# Patient Record
Sex: Female | Born: 1965 | Race: White | Hispanic: No | Smoking: Former smoker
Health system: Southern US, Community
[De-identification: ages and names within clinical notes are randomized; demographics above are authoritative.]

## PROBLEM LIST (undated history)

## (undated) DIAGNOSIS — I10 Essential (primary) hypertension: Secondary | ICD-10-CM

## (undated) HISTORY — PX: KNEE SURGERY: SHX244

---

## 1998-04-24 ENCOUNTER — Other Ambulatory Visit: Admission: RE | Admit: 1998-04-24 | Discharge: 1998-04-24 | Payer: Self-pay | Admitting: Gynecology

## 1998-11-13 ENCOUNTER — Other Ambulatory Visit: Admission: RE | Admit: 1998-11-13 | Discharge: 1998-11-13 | Payer: Self-pay | Admitting: Gynecology

## 1999-02-12 ENCOUNTER — Encounter: Admission: RE | Admit: 1999-02-12 | Discharge: 1999-05-13 | Payer: Self-pay | Admitting: Gynecology

## 1999-06-04 ENCOUNTER — Inpatient Hospital Stay (HOSPITAL_COMMUNITY): Admission: AD | Admit: 1999-06-04 | Discharge: 1999-06-07 | Payer: Self-pay | Admitting: Gynecology

## 1999-07-23 ENCOUNTER — Other Ambulatory Visit: Admission: RE | Admit: 1999-07-23 | Discharge: 1999-07-23 | Payer: Self-pay | Admitting: Gynecology

## 2001-04-13 ENCOUNTER — Other Ambulatory Visit: Admission: RE | Admit: 2001-04-13 | Discharge: 2001-04-13 | Payer: Self-pay | Admitting: Gynecology

## 2003-03-01 ENCOUNTER — Other Ambulatory Visit: Admission: RE | Admit: 2003-03-01 | Discharge: 2003-03-01 | Payer: Self-pay | Admitting: Family Medicine

## 2003-03-09 ENCOUNTER — Encounter: Payer: Self-pay | Admitting: Family Medicine

## 2003-03-09 ENCOUNTER — Ambulatory Visit (HOSPITAL_COMMUNITY): Admission: RE | Admit: 2003-03-09 | Discharge: 2003-03-09 | Payer: Self-pay | Admitting: Family Medicine

## 2003-12-29 ENCOUNTER — Emergency Department (HOSPITAL_COMMUNITY): Admission: EM | Admit: 2003-12-29 | Discharge: 2003-12-30 | Payer: Self-pay | Admitting: Emergency Medicine

## 2006-07-21 ENCOUNTER — Encounter (INDEPENDENT_AMBULATORY_CARE_PROVIDER_SITE_OTHER): Payer: Self-pay | Admitting: *Deleted

## 2006-07-21 ENCOUNTER — Encounter: Admission: RE | Admit: 2006-07-21 | Discharge: 2006-07-21 | Payer: Self-pay | Admitting: Family Medicine

## 2007-09-16 ENCOUNTER — Other Ambulatory Visit: Admission: RE | Admit: 2007-09-16 | Discharge: 2007-09-16 | Payer: Self-pay | Admitting: Obstetrics and Gynecology

## 2008-12-21 ENCOUNTER — Other Ambulatory Visit: Admission: RE | Admit: 2008-12-21 | Discharge: 2008-12-21 | Payer: Self-pay | Admitting: Obstetrics & Gynecology

## 2012-11-27 ENCOUNTER — Encounter (HOSPITAL_COMMUNITY): Payer: Self-pay | Admitting: Emergency Medicine

## 2012-11-27 ENCOUNTER — Emergency Department (HOSPITAL_COMMUNITY)
Admission: EM | Admit: 2012-11-27 | Discharge: 2012-11-28 | Disposition: A | Discharge status: Home / Self Care | Payer: BC Managed Care – PPO | Attending: Emergency Medicine | Admitting: Emergency Medicine

## 2012-11-27 ENCOUNTER — Emergency Department (HOSPITAL_COMMUNITY): Payer: BC Managed Care – PPO

## 2012-11-27 DIAGNOSIS — R5381 Other malaise: Secondary | ICD-10-CM | POA: Insufficient documentation

## 2012-11-27 DIAGNOSIS — Z79899 Other long term (current) drug therapy: Secondary | ICD-10-CM | POA: Insufficient documentation

## 2012-11-27 DIAGNOSIS — Z3202 Encounter for pregnancy test, result negative: Secondary | ICD-10-CM | POA: Insufficient documentation

## 2012-11-27 DIAGNOSIS — J9801 Acute bronchospasm: Secondary | ICD-10-CM

## 2012-11-27 DIAGNOSIS — H9209 Otalgia, unspecified ear: Secondary | ICD-10-CM | POA: Insufficient documentation

## 2012-11-27 DIAGNOSIS — Z87891 Personal history of nicotine dependence: Secondary | ICD-10-CM | POA: Insufficient documentation

## 2012-11-27 DIAGNOSIS — I10 Essential (primary) hypertension: Secondary | ICD-10-CM | POA: Insufficient documentation

## 2012-11-27 DIAGNOSIS — R05 Cough: Secondary | ICD-10-CM

## 2012-11-27 DIAGNOSIS — R059 Cough, unspecified: Secondary | ICD-10-CM | POA: Insufficient documentation

## 2012-11-27 HISTORY — DX: Essential (primary) hypertension: I10

## 2012-11-27 LAB — BLOOD GAS, ARTERIAL
Acid-base deficit: 5.5 mmol/L — ABNORMAL HIGH (ref 0.0–2.0)
Bicarbonate: 18.4 mEq/L — ABNORMAL LOW (ref 20.0–24.0)
TCO2: 16.4 mmol/L (ref 0–100)
pCO2 arterial: 30.3 mmHg — ABNORMAL LOW (ref 35.0–45.0)
pH, Arterial: 7.401 (ref 7.350–7.450)
pO2, Arterial: 93.2 mmHg (ref 80.0–100.0)

## 2012-11-27 LAB — URINALYSIS, ROUTINE W REFLEX MICROSCOPIC
Leukocytes, UA: NEGATIVE
Nitrite: NEGATIVE
Protein, ur: NEGATIVE mg/dL
Specific Gravity, Urine: 1.01 (ref 1.005–1.030)
Urobilinogen, UA: 0.2 mg/dL (ref 0.0–1.0)

## 2012-11-27 LAB — URINE MICROSCOPIC-ADD ON

## 2012-11-27 LAB — BASIC METABOLIC PANEL
CO2: 28 mEq/L (ref 19–32)
Calcium: 9.4 mg/dL (ref 8.4–10.5)
Chloride: 100 mEq/L (ref 96–112)
Creatinine, Ser: 0.65 mg/dL (ref 0.50–1.10)
GFR calc Af Amer: >90 mL/min (ref >90)
Sodium: 136 mEq/L (ref 135–145)

## 2012-11-27 LAB — CBC WITH DIFFERENTIAL/PLATELET
Basophils Absolute: 0 10*3/uL (ref 0.0–0.1)
Eosinophils Relative: 1 % (ref 0–5)
HCT: 40.9 % (ref 36.0–46.0)
Lymphocytes Relative: 9 % — ABNORMAL LOW (ref 12–46)
Lymphs Abs: 0.8 10*3/uL (ref 0.7–4.0)
MCV: 87 fL (ref 78.0–100.0)
Neutro Abs: 8.1 10*3/uL — ABNORMAL HIGH (ref 1.7–7.7)
Platelets: 350 10*3/uL (ref 150–400)
RBC: 4.7 MIL/uL (ref 3.87–5.11)
RDW: 12.6 % (ref 11.5–15.5)
WBC: 9.3 10*3/uL (ref 4.0–10.5)

## 2012-11-27 LAB — PREGNANCY, URINE: Preg Test, Ur: NEGATIVE

## 2012-11-27 LAB — RAPID STREP SCREEN (MED CTR MEBANE ONLY): Streptococcus, Group A Screen (Direct): NEGATIVE

## 2012-11-27 LAB — TROPONIN I: Troponin I: <0.3 ng/mL (ref <0.30)

## 2012-11-27 MED ORDER — ALBUTEROL (5 MG/ML) CONTINUOUS INHALATION SOLN
INHALATION_SOLUTION | RESPIRATORY_TRACT | Status: AC
  Filled 2012-11-27: qty 20

## 2012-11-27 MED ORDER — IPRATROPIUM BROMIDE 0.02 % IN SOLN
1.0000 mg | Freq: Once | RESPIRATORY_TRACT | Status: AC
  Administered 2012-11-27: 1 mg via RESPIRATORY_TRACT
  Filled 2012-11-27: qty 5

## 2012-11-27 MED ORDER — IOHEXOL 350 MG/ML SOLN
100.0000 mL | Freq: Once | INTRAVENOUS | Status: AC | PRN
  Administered 2012-11-27: 100 mL via INTRAVENOUS

## 2012-11-27 MED ORDER — POTASSIUM CHLORIDE CRYS ER 20 MEQ PO TBCR
EXTENDED_RELEASE_TABLET | ORAL | Status: AC
  Filled 2012-11-27: qty 2

## 2012-11-27 MED ORDER — POTASSIUM CHLORIDE 20 MEQ/15ML (10%) PO LIQD
40.0000 meq | Freq: Once | ORAL | Status: DC
  Filled 2012-11-27: qty 30

## 2012-11-27 MED ORDER — ALBUTEROL SULFATE (5 MG/ML) 0.5% IN NEBU
15.0000 mg | INHALATION_SOLUTION | Freq: Once | RESPIRATORY_TRACT | Status: AC
  Administered 2012-11-27: 15 mg via RESPIRATORY_TRACT
  Filled 2012-11-27: qty 3

## 2012-11-27 MED ORDER — PREDNISONE 50 MG PO TABS
60.0000 mg | ORAL_TABLET | Freq: Once | ORAL | Status: AC
  Administered 2012-11-27: 60 mg via ORAL
  Filled 2012-11-27: qty 1

## 2012-11-27 MED ORDER — POTASSIUM CHLORIDE 20 MEQ PO PACK: 40.0000 meq | PACK | Freq: Once | ORAL | Status: DC

## 2012-11-27 MED ORDER — POTASSIUM CHLORIDE CRYS ER 20 MEQ PO TBCR
40.0000 meq | EXTENDED_RELEASE_TABLET | Freq: Two times a day (BID) | ORAL | Status: DC
  Administered 2012-11-27: 40 meq via ORAL

## 2012-11-27 NOTE — ED Provider Notes (Signed)
History     CSN: [id number]  Arrival date & time 11/27/12  1940   First MD Initiated Contact with Patient 11/27/12 1952      Chief Complaint  Patient presents with  . Shortness of Breath     HPI Pt was seen at 2005.   Per pt, c/o gradual onset and worsening of persistent SOB, cough, and "wheezing" for the past 3 to 4 weeks.  States she has been eval by her PMD x2 for same, dx bronchitis, tx steroids, MDI, and 2 different abx (levaquin and omnicef) without relief.  Pt states her symptoms continue to worsen and have been associated with generalized weakness/fatigue, sore throat and ears congestion.  Denies CP/palpitations, no back pain, no abd pain, no N/V/D, no fevers, no rash.     PMD: [place] Past Medical History  Diagnosis Date  . Hypertension     History reviewed. No pertinent past surgical history.  Family History  Problem Relation Age of Onset  . Diabetes Mother   . Diabetes Brother     History  Substance Use Topics  . Smoking status: Former Smoker    Types: Cigarettes    Quit date: 11/27/2002  . Smokeless tobacco: Not on file  . Alcohol Use: Yes     Comment: ocass    OB History    Grav Para Term Preterm Abortions TAB SAB Ect Mult Living   2 2 2              Review of Systems ROS: Statement: All systems negative except as marked or noted in the HPI; Constitutional: Negative for fever and chills. +generalized weakness/fatigue ; ; Eyes: Negative for eye pain, redness and discharge. ; ; ENMT: Negative for ear pain, hoarseness, nasal congestion, sinus pressure and +sore throat, ears congestion. ; ; Cardiovascular: Negative for chest pain, palpitations, diaphoresis, and peripheral edema. ; ; Respiratory: +cough, wheezing, SOB. Negative for stridor. ; ; Gastrointestinal: Negative for nausea, vomiting, diarrhea, abdominal pain, blood in stool, hematemesis, jaundice and rectal bleeding. . ; ; Genitourinary: Negative for dysuria, flank pain and hematuria. ; ;  Musculoskeletal: Negative for back pain and neck pain. Negative for swelling and trauma.; ; Skin: Negative for pruritus, rash, abrasions, blisters, bruising and skin lesion.; ; Neuro: Negative for headache, lightheadedness and neck stiffness. Negative for altered level of consciousness , altered mental status, extremity weakness, paresthesias, involuntary movement, seizure and syncope.     Allergies  Penicillins  Home Medications   Current Outpatient Rx  Name  Route  Sig  Dispense  Refill  . CEFDINIR 300 MG PO CAPS   Oral   Take 300 mg by mouth 2 (two) times daily. 11/20/2012         . CLONAZEPAM 1 MG PO TABS   Oral   Take 1 mg by mouth at bedtime as needed. sleep         . HYDROCODONE-HOMATROPINE 5-1.5 MG/5ML PO SYRP   Oral   Take 5 mLs by mouth every 6 (six) hours as needed. cough         . LEVONORGESTREL 20 MCG/24HR IU IUD   Intrauterine   1 each by Intrauterine route once.         Marland Kitchen LISINOPRIL 5 MG PO TABS   Oral   Take 5 mg by mouth daily.         Marland Kitchen RANITIDINE HCL 150 MG PO TABS   Oral   Take 150 mg by mouth 2 (two) times daily.         Marland Kitchen  VENLAFAXINE HCL ER 150 MG PO CP24   Oral   Take 150 mg by mouth daily.           BP 152/77  Pulse 95  Temp 98.6 F (37 C) (Oral)  Resp 18  SpO2 96%  Physical Exam 2010: Physical examination:  Nursing notes reviewed; Vital signs and O2 SAT reviewed;  Constitutional: Well developed, Well nourished, Well hydrated, In no acute distress; Head:  Normocephalic, atraumatic; Eyes: EOMI, PERRL, No scleral icterus; ENMT: TM's clear bilat. +edemetous nasal turbinates bilat with clear rhinorrhea. Mouth and pharynx normal, Mucous membranes moist; Neck: Supple, Full range of motion, No lymphadenopathy; Cardiovascular: Regular rate and rhythm, No gallop; Respiratory: Breath sounds coarse & equal bilaterally, scattered faint wheezes. No audible wheezing. Speaking in phrases, sitting upright. Tachypneic.; Chest: Nontender, Movement  normal; Abdomen: Soft, Nontender, Nondistended, Normal bowel sounds; Genitourinary: No CVA tenderness; Extremities: Pulses normal, No tenderness, No edema, No calf edema or asymmetry.; Neuro: AA&Ox3, Major CN grossly intact.  Speech clear. No gross focal motor or sensory deficits in extremities.; Skin: Color normal, Warm, Dry.   ED Course  Procedures    MDM  MDM Reviewed: previous chart, nursing note and vitals Interpretation: labs and x-ray   Results for orders placed during the hospital encounter of 11/27/12  RAPID STREP SCREEN      Component Value Range   Streptococcus, Group A Screen (Direct) NEGATIVE  NEGATIVE  BASIC METABOLIC PANEL      Component Value Range   Sodium 136  135 - 145 mEq/L   Potassium 3.2 (*) 3.5 - 5.1 mEq/L   Chloride 100  96 - 112 mEq/L   CO2 28  19 - 32 mEq/L   Glucose, Bld 154 (*) 70 - 99 mg/dL   BUN 7  6 - 23 mg/dL   Creatinine, Ser 1.61  0.50 - 1.10 mg/dL   Calcium 9.4  8.4 - 09.6 mg/dL   GFR calc non Af Amer >90  >90 mL/min   GFR calc Af Amer >90  >90 mL/min  CBC WITH DIFFERENTIAL      Component Value Range   WBC 9.3  4.0 - 10.5 K/uL   RBC 4.70  3.87 - 5.11 MIL/uL   Hemoglobin 13.8  12.0 - 15.0 g/dL   HCT 04.5  40.9 - 81.1 %   MCV 87.0  78.0 - 100.0 fL   MCH 29.4  26.0 - 34.0 pg   MCHC 33.7  30.0 - 36.0 g/dL   RDW 91.4  78.2 - 95.6 %   Platelets 350  150 - 400 K/uL   Neutrophils Relative 87 (*) 43 - 77 %   Neutro Abs 8.1 (*) 1.7 - 7.7 K/uL   Lymphocytes Relative 9 (*) 12 - 46 %   Lymphs Abs 0.8  0.7 - 4.0 K/uL   Monocytes Relative 3  3 - 12 %   Monocytes Absolute 0.3  0.1 - 1.0 K/uL   Eosinophils Relative 1  0 - 5 %   Eosinophils Absolute 0.1  0.0 - 0.7 K/uL   Basophils Relative 0  0 - 1 %   Basophils Absolute 0.0  0.0 - 0.1 K/uL  TROPONIN I      Component Value Range   Troponin I <0.30  <0.30 ng/mL  D-DIMER, QUANTITATIVE      Component Value Range   D-Dimer, Quant 0.29  0.00 - 0.48 ug/mL-FEU  URINALYSIS, ROUTINE W REFLEX MICROSCOPIC       Component Value Range  Color, Urine YELLOW  YELLOW   APPearance CLEAR  CLEAR   Specific Gravity, Urine 1.010  1.005 - 1.030   pH 6.5  5.0 - 8.0   Glucose, UA NEGATIVE  NEGATIVE mg/dL   Hgb urine dipstick TRACE (*) NEGATIVE   Bilirubin Urine NEGATIVE  NEGATIVE   Ketones, ur NEGATIVE  NEGATIVE mg/dL   Protein, ur NEGATIVE  NEGATIVE mg/dL   Urobilinogen, UA 0.2  0.0 - 1.0 mg/dL   Nitrite NEGATIVE  NEGATIVE   Leukocytes, UA NEGATIVE  NEGATIVE  PREGNANCY, URINE      Component Value Range   Preg Test, Ur NEGATIVE  NEGATIVE  URINE MICROSCOPIC-ADD ON      Component Value Range   Squamous Epithelial / LPF MANY (*) RARE   WBC, UA 3-6  <3 WBC/hpf   RBC / HPF 3-6  <3 RBC/hpf   Bacteria, UA RARE  RARE   Dg Chest 2 View 11/27/2012  *RADIOLOGY REPORT*  Clinical Data: Cough and rule out infiltrate.  CHEST - 2 VIEW  Comparison: 11/18/2012  Findings: Two views of the chest were obtained.  There is a questionable lung nodule in the posterior lung base that roughly measures 1 cm.  Slightly increased interstitial densities in the lower chest, particularly in the left lower lung.  No evidence for consolidation or airspace disease.  Heart size is normal.  IMPRESSION: Slightly prominent interstitial markings.  Findings could represent atelectasis.  Cannot exclude subtle disease in the left mid and lower lung region.  Concern for a 1 cm nodular density in the posterior lower chest. Recommend further characterization with a chest CT to rule out a pulmonary nodule.   Original Report Authenticated By: Richarda Overlie, M.D.     2225:  Pt states she feels "no better" after hour long neb and prednisone.  Continues tachypneic, speaking in phrases, lungs coarse, Sats 96% R/A.  Will obtain CT-A chest to r/o PE, pneumonia and further delineate nodular density on CXR above.  Will also obtain ABG.     2315:  CT-A chest and ABG pending.  Sign out to Dr. Colon Branch.        Laray Anger, DO 11/28/12 1514

## 2012-11-27 NOTE — ED Notes (Signed)
Pt in ct 

## 2012-11-27 NOTE — ED Notes (Signed)
Pt here for SOB. Has been seen and treated with mult abx. Worsening cough, work of breathing and weakness.

## 2012-11-28 MED ORDER — ALBUTEROL SULFATE (5 MG/ML) 0.5% IN NEBU
5.0000 mg | INHALATION_SOLUTION | Freq: Once | RESPIRATORY_TRACT | Status: AC
  Administered 2012-11-28: 5 mg via RESPIRATORY_TRACT
  Filled 2012-11-28: qty 1

## 2012-11-28 MED ORDER — PREDNISONE 10 MG PO TABS: 20.0000 mg | ORAL_TABLET | Freq: Every day | ORAL | Status: DC

## 2012-11-28 MED ORDER — ALBUTEROL SULFATE HFA 108 (90 BASE) MCG/ACT IN AERS: INHALATION_SPRAY | RESPIRATORY_TRACT | Status: DC

## 2012-11-28 NOTE — ED Notes (Signed)
In to speak with pt. Pt made aware that all results are back. Pt still questioning why she feels so bad and is short of breath. Told pt I would have EDP to come speak with her. EDP aware and will see pt.

## 2012-11-28 NOTE — ED Notes (Signed)
Pt has been discharged but her ride will not be here for 45 mins. Pt given ginger ale and allowed to stay in room until ride gets here.

## 2012-11-28 NOTE — ED Provider Notes (Signed)
2315 Assumed care/disposition of patient who presents with persistent SOB associated with generalized weakness, cough and increased respiratory effort despite having been treated with several courses of antibiotics.She has been given nebulizer treatment and steroids.Potassium was slightly low and she has been given potassium.  Awaiting CT to r/o PE and ABG. 0038 ABG with respiratory alkalosis. CT negative for PE. Shows patchy atelectasis. Chest: Occasional end expiratory wheeze, crackles at both bases. Able to breath with mouth closed. Talks is short phrases. Respiratory rate 18. Ordered albuterol treatment. Reviewed results with the patient. Will discharge on prednisone and albuterol. Referral to pulmonologist. Follow up with PCP.  Pt stable in ED with no significant deterioration in condition.The patient appears reasonably screened and/or stabilized for discharge and I doubt any other medical condition or other [facility] requiring further screening, evaluation, or treatment in the ED at this time prior to discharge.  Results for orders placed during the hospital encounter of 11/27/12  RAPID STREP SCREEN      Component Value Range   Streptococcus, Group A Screen (Direct) NEGATIVE  NEGATIVE  BASIC METABOLIC PANEL      Component Value Range   Sodium 136  135 - 145 mEq/L   Potassium 3.2 (*) 3.5 - 5.1 mEq/L   Chloride 100  96 - 112 mEq/L   CO2 28  19 - 32 mEq/L   Glucose, Bld 154 (*) 70 - 99 mg/dL   BUN 7  6 - 23 mg/dL   Creatinine, Ser 2.95  0.50 - 1.10 mg/dL   Calcium 9.4  8.4 - 62.1 mg/dL   GFR calc non Af Amer >90  >90 mL/min   GFR calc Af Amer >90  >90 mL/min  CBC WITH DIFFERENTIAL      Component Value Range   WBC 9.3  4.0 - 10.5 K/uL   RBC 4.70  3.87 - 5.11 MIL/uL   Hemoglobin 13.8  12.0 - 15.0 g/dL   HCT 30.8  65.7 - 84.6 %   MCV 87.0  78.0 - 100.0 fL   MCH 29.4  26.0 - 34.0 pg   MCHC 33.7  30.0 - 36.0 g/dL   RDW 96.2  95.2 - 84.1 %   Platelets 350  150 - 400 K/uL   Neutrophils Relative  87 (*) 43 - 77 %   Neutro Abs 8.1 (*) 1.7 - 7.7 K/uL   Lymphocytes Relative 9 (*) 12 - 46 %   Lymphs Abs 0.8  0.7 - 4.0 K/uL   Monocytes Relative 3  3 - 12 %   Monocytes Absolute 0.3  0.1 - 1.0 K/uL   Eosinophils Relative 1  0 - 5 %   Eosinophils Absolute 0.1  0.0 - 0.7 K/uL   Basophils Relative 0  0 - 1 %   Basophils Absolute 0.0  0.0 - 0.1 K/uL  TROPONIN I      Component Value Range   Troponin I <0.30  <0.30 ng/mL  D-DIMER, QUANTITATIVE      Component Value Range   D-Dimer, Quant 0.29  0.00 - 0.48 ug/mL-FEU  URINALYSIS, ROUTINE W REFLEX MICROSCOPIC      Component Value Range   Color, Urine YELLOW  YELLOW   APPearance CLEAR  CLEAR   Specific Gravity, Urine 1.010  1.005 - 1.030   pH 6.5  5.0 - 8.0   Glucose, UA NEGATIVE  NEGATIVE mg/dL   Hgb urine dipstick TRACE (*) NEGATIVE   Bilirubin Urine NEGATIVE  NEGATIVE   Ketones, ur NEGATIVE  NEGATIVE  mg/dL   Protein, ur NEGATIVE  NEGATIVE mg/dL   Urobilinogen, UA 0.2  0.0 - 1.0 mg/dL   Nitrite NEGATIVE  NEGATIVE   Leukocytes, UA NEGATIVE  NEGATIVE  PREGNANCY, URINE      Component Value Range   Preg Test, Ur NEGATIVE  NEGATIVE  URINE MICROSCOPIC-ADD ON      Component Value Range   Squamous Epithelial / LPF MANY (*) RARE   WBC, UA 3-6  <3 WBC/hpf   RBC / HPF 3-6  <3 RBC/hpf   Bacteria, UA RARE  RARE  BLOOD GAS, ARTERIAL      Component Value Range   FIO2 0.21     Delivery systems ROOM AIR     pH, Arterial 7.401  7.350 - 7.450   pCO2 arterial 30.3 (*) 35.0 - 45.0 mmHg   pO2, Arterial 93.2  80.0 - 100.0 mmHg   Bicarbonate 18.4 (*) 20.0 - 24.0 mEq/L   TCO2 16.4  0 - 100 mmol/L   Acid-base deficit 5.5 (*) 0.0 - 2.0 mmol/L   O2 Saturation 97.6     Patient temperature 37.0     Collection site LEFT RADIAL     Drawn by COLLECTED BY RT     Sample type ARTERIAL     Allens test (pass/fail) PASS  PASS  Dg Chest 2 View  11/27/2012  *RADIOLOGY REPORT*  Clinical Data: Cough and rule out infiltrate.  CHEST - 2 VIEW  Comparison:  11/18/2012  Findings: Two views of the chest were obtained.  There is a questionable lung nodule in the posterior lung base that roughly measures 1 cm.  Slightly increased interstitial densities in the lower chest, particularly in the left lower lung.  No evidence for consolidation or airspace disease.  Heart size is normal.  IMPRESSION: Slightly prominent interstitial markings.  Findings could represent atelectasis.  Cannot exclude subtle disease in the left mid and lower lung region.  Concern for a 1 cm nodular density in the posterior lower chest. Recommend further characterization with a chest CT to rule out a pulmonary nodule.   Original Report Authenticated By: Richarda Overlie, M.D.    Ct Angio Chest Pe W/cm &/or Wo Cm  11/27/2012  *RADIOLOGY REPORT*  Clinical Data: Shortness of breath and worsening cough.  Weakness.  CT ANGIOGRAPHY CHEST  Technique:  Multidetector CT imaging of the chest using the standard protocol during bolus administration of intravenous contrast. Multiplanar reconstructed images including MIPs were obtained and reviewed to evaluate the vascular anatomy.  Contrast: OMNIPAQUE IOHEXOL 350 MG/ML SOLN  Comparison: Chest radiograph performed earlier today at 08:38 p.m.  Findings: There is no evidence of significant pulmonary embolus.  Mild patchy bilateral atelectasis is noted.  The lungs are otherwise clear.  There is no evidence of pleural effusion or pneumothorax.  No masses are identified; no abnormal focal contrast enhancement is seen.  Scattered bilateral hilar and peribronchial nodes remain borderline normal in size.  The mediastinum is unremarkable in appearance.  No mediastinal lymphadenopathy is seen.  No pericardial effusion is identified.  The great vessels are grossly unremarkable in appearance.  No axillary lymphadenopathy is seen.  The visualized portions of the thyroid gland are unremarkable in appearance.  The visualized portions of the liver and spleen are unremarkable. The  visualized portions of the pancreas, gallbladder, stomach, adrenal glands and kidneys are within normal limits.  No acute osseous abnormalities are seen.  IMPRESSION:  1.  No evidence of significant pulmonary embolus. 2.  Mild patchy  bilateral atelectasis noted; lungs otherwise clear.   Original Report Authenticated By: Tonia Ghent, M.D.     Nicoletta Dress. Colon Branch, MD 11/28/12 1610

## 2012-11-28 NOTE — ED Notes (Signed)
edp in with pt, orders received

## 2014-09-10 ENCOUNTER — Encounter (HOSPITAL_COMMUNITY): Payer: Self-pay | Admitting: Emergency Medicine

## 2015-03-22 ENCOUNTER — Other Ambulatory Visit: Payer: Self-pay | Admitting: Physician Assistant

## 2016-07-18 DIAGNOSIS — R7303 Prediabetes: Secondary | ICD-10-CM | POA: Insufficient documentation

## 2016-07-18 DIAGNOSIS — L309 Dermatitis, unspecified: Secondary | ICD-10-CM | POA: Insufficient documentation

## 2016-07-18 DIAGNOSIS — E669 Obesity, unspecified: Secondary | ICD-10-CM | POA: Insufficient documentation

## 2016-07-18 DIAGNOSIS — N951 Menopausal and female climacteric states: Secondary | ICD-10-CM | POA: Insufficient documentation

## 2016-07-18 DIAGNOSIS — F411 Generalized anxiety disorder: Secondary | ICD-10-CM | POA: Insufficient documentation

## 2016-07-18 DIAGNOSIS — F339 Major depressive disorder, recurrent, unspecified: Secondary | ICD-10-CM | POA: Insufficient documentation

## 2016-07-18 DIAGNOSIS — I10 Essential (primary) hypertension: Secondary | ICD-10-CM | POA: Insufficient documentation

## 2016-07-18 DIAGNOSIS — J45909 Unspecified asthma, uncomplicated: Secondary | ICD-10-CM | POA: Insufficient documentation

## 2016-07-21 DIAGNOSIS — E7800 Pure hypercholesterolemia, unspecified: Secondary | ICD-10-CM | POA: Insufficient documentation

## 2017-06-02 ENCOUNTER — Ambulatory Visit: Payer: Self-pay | Admitting: Podiatry

## 2019-10-19 ENCOUNTER — Emergency Department (HOSPITAL_COMMUNITY)
Admission: EM | Admit: 2019-10-19 | Discharge: 2019-10-19 | Disposition: A | Discharge status: Home / Self Care | Payer: Commercial Managed Care - PPO | Attending: Emergency Medicine | Admitting: Emergency Medicine

## 2019-10-19 ENCOUNTER — Other Ambulatory Visit: Payer: Self-pay

## 2019-10-19 ENCOUNTER — Emergency Department (HOSPITAL_COMMUNITY): Payer: Commercial Managed Care - PPO

## 2019-10-19 ENCOUNTER — Encounter (HOSPITAL_COMMUNITY): Payer: Self-pay

## 2019-10-19 DIAGNOSIS — S0990XA Unspecified injury of head, initial encounter: Secondary | ICD-10-CM

## 2019-10-19 DIAGNOSIS — Y939 Activity, unspecified: Secondary | ICD-10-CM | POA: Insufficient documentation

## 2019-10-19 DIAGNOSIS — I10 Essential (primary) hypertension: Secondary | ICD-10-CM | POA: Insufficient documentation

## 2019-10-19 DIAGNOSIS — S0181XA Laceration without foreign body of other part of head, initial encounter: Secondary | ICD-10-CM

## 2019-10-19 DIAGNOSIS — W010XXA Fall on same level from slipping, tripping and stumbling without subsequent striking against object, initial encounter: Secondary | ICD-10-CM | POA: Diagnosis not present

## 2019-10-19 DIAGNOSIS — S0083XA Contusion of other part of head, initial encounter: Secondary | ICD-10-CM

## 2019-10-19 DIAGNOSIS — S060X0A Concussion without loss of consciousness, initial encounter: Secondary | ICD-10-CM

## 2019-10-19 DIAGNOSIS — Z79899 Other long term (current) drug therapy: Secondary | ICD-10-CM | POA: Diagnosis not present

## 2019-10-19 DIAGNOSIS — Z87891 Personal history of nicotine dependence: Secondary | ICD-10-CM | POA: Insufficient documentation

## 2019-10-19 DIAGNOSIS — Y929 Unspecified place or not applicable: Secondary | ICD-10-CM | POA: Insufficient documentation

## 2019-10-19 DIAGNOSIS — Y999 Unspecified external cause status: Secondary | ICD-10-CM | POA: Diagnosis not present

## 2019-10-19 MED ORDER — HYDROCODONE-ACETAMINOPHEN 5-325 MG PO TABS
2.0000 | ORAL_TABLET | Freq: Once | ORAL | Status: AC
  Administered 2019-10-19: 2 via ORAL
  Filled 2019-10-19: qty 2

## 2019-10-19 MED ORDER — LIDOCAINE-EPINEPHRINE (PF) 2 %-1:200000 IJ SOLN
INTRAMUSCULAR | Status: AC
  Filled 2019-10-19: qty 20

## 2019-10-19 MED ORDER — LIDOCAINE-EPINEPHRINE 2 %-1:100000 IJ SOLN
20.0000 mL | Freq: Once | INTRAMUSCULAR | Status: DC
  Filled 2019-10-19: qty 20

## 2019-10-19 MED ORDER — KETOROLAC TROMETHAMINE 15 MG/ML IJ SOLN
15.0000 mg | Freq: Once | INTRAMUSCULAR | Status: AC
  Administered 2019-10-19: 15 mg via INTRAVENOUS
  Filled 2019-10-19: qty 1

## 2019-10-19 MED ORDER — SODIUM CHLORIDE 0.9 % IV BOLUS
500.0000 mL | Freq: Once | INTRAVENOUS | Status: AC
  Administered 2019-10-19: 1000 mL via INTRAVENOUS

## 2019-10-19 MED ORDER — CYCLOBENZAPRINE HCL 10 MG PO TABS: 10.0000 mg | ORAL_TABLET | Freq: Two times a day (BID) | ORAL | 0 refills | Status: AC | PRN

## 2019-10-19 MED ORDER — TETANUS-DIPHTH-ACELL PERTUSSIS 5-2.5-18.5 LF-MCG/0.5 IM SUSP
0.5000 mL | Freq: Once | INTRAMUSCULAR | Status: AC
  Administered 2019-10-19: 0.5 mL via INTRAMUSCULAR
  Filled 2019-10-19: qty 0.5

## 2019-10-19 MED ORDER — DIAZEPAM 5 MG PO TABS
5.0000 mg | ORAL_TABLET | Freq: Once | ORAL | Status: AC
  Administered 2019-10-19: 5 mg via ORAL
  Filled 2019-10-19: qty 1

## 2019-10-19 MED ORDER — MORPHINE SULFATE (PF) 4 MG/ML IV SOLN
4.0000 mg | Freq: Once | INTRAVENOUS | Status: AC
  Administered 2019-10-19: 11:00:00 4 mg via INTRAVENOUS
  Filled 2019-10-19: qty 1

## 2019-10-19 MED ORDER — HYDROCODONE-ACETAMINOPHEN 5-325 MG PO TABS: 1.0000 | ORAL_TABLET | Freq: Four times a day (QID) | ORAL | 0 refills | Status: AC | PRN

## 2019-10-19 NOTE — Discharge Instructions (Signed)
Use ice, Tylenol and ibuprofen as needed for pain.  Watch for signs of infection.  You can shower as usual just be careful with the sutures.  The stitches will absorb in approximately 1 week. Return for recurrent vomiting, neurologic concerns or new findings.

## 2019-10-19 NOTE — ED Triage Notes (Signed)
Pt reports she tripped on the sidewalk and fell.  Pt struck head on steps.  EMS says pt has a large laceration to forehead.  Bleeding controlled.  EMS gave 19mcg fentanyl and 4mg  zofran pta.  No loss of consciousness

## 2019-10-19 NOTE — ED Notes (Signed)
ED Provider at bedside. 

## 2019-10-19 NOTE — ED Provider Notes (Addendum)
[facility] EMERGENCY DEPARTMENT Provider Note   CSN: [id number] Arrival date & time: 10/19/19  0957     History Chief Complaint  Patient presents with  . Fall    Karen Mccormick is a 53 y.o. female.  Patient presents for assessment after head injury.  Patient was walking had mechanical fall and tripped and hit her head on the edge of a step with laceration to left forehead.  No syncope, no seizures, no blood thinners.  No vomiting.  Pain significant fentanyl given on route.        Past Medical History:  Diagnosis Date  . Hypertension     There are no problems to display for this patient.   Past Surgical History:  Procedure Laterality Date  . KNEE SURGERY       OB History    Gravida  2   Para  2   Term  2   Preterm      AB      Living        SAB      TAB      Ectopic      Multiple      Live Births              Family History  Problem Relation Age of Onset  . Diabetes Mother   . Diabetes Brother     Social History   Tobacco Use  . Smoking status: Former Smoker    Types: Cigarettes    Quit date: 11/27/2002    Years since quitting: 16.9  . Smokeless tobacco: Never Used  Substance Use Topics  . Alcohol use: Yes    Comment: ocass  . Drug use: No    Home Medications Prior to Admission medications   Medication Sig Start Date End Date Taking? Authorizing Provider  albuterol (PROVENTIL HFA;VENTOLIN HFA) 108 (90 BASE) MCG/ACT inhaler 2 puffs 4 times a day for the next 4 days then as needed for wheezing. 11/28/12   Prentiss Bells, MD  cefdinir (OMNICEF) 300 MG capsule Take 300 mg by mouth 2 (two) times daily. 11/20/2012    [provider]  clonazePAM (KLONOPIN) 1 MG tablet Take 1 mg by mouth at bedtime as needed. sleep    [provider]  cyclobenzaprine (FLEXERIL) 10 MG tablet Take 1 tablet (10 mg total) by mouth 2 (two) times daily as needed for muscle spasms. 10/19/19   Elnora Morrison, MD  HYDROcodone-acetaminophen  (NORCO) 5-325 MG tablet Take 1-2 tablets by mouth every 6 (six) hours as needed for up to 3 days. 10/19/19 10/22/19  Elnora Morrison, MD  HYDROcodone-homatropine [facility]) 5-1.5 MG/5ML syrup Take 5 mLs by mouth every 6 (six) hours as needed. cough    [provider]  levonorgestrel (MIRENA) 20 MCG/24HR IUD 1 each by Intrauterine route once.    [provider]  lisinopril (PRINIVIL,ZESTRIL) 5 MG tablet Take 5 mg by mouth daily.    [provider]  predniSONE (DELTASONE) 10 MG tablet Take 2 tablets (20 mg total) by mouth daily. 11/28/12   Prentiss Bells, MD  ranitidine (ZANTAC) 150 MG tablet Take 150 mg by mouth 2 (two) times daily.    [provider]  venlafaxine XR (EFFEXOR-XR) 150 MG 24 hr capsule Take 150 mg by mouth daily.    [provider]    Allergies    Penicillins  Review of Systems   Review of Systems  Constitutional: Negative for chills and fever.  HENT:  Negative for congestion.   Eyes: Negative for visual disturbance.  Respiratory: Negative for shortness of breath.   Cardiovascular: Negative for chest pain.  Gastrointestinal: Negative for abdominal pain and vomiting.  Genitourinary: Negative for dysuria and flank pain.  Musculoskeletal: Negative for back pain, neck pain and neck stiffness.  Skin: Positive for wound. Negative for rash.  Neurological: Positive for headaches. Negative for light-headedness.    Physical Exam Updated Vital Signs BP (!) 148/84 (BP Location: Right Arm)   Pulse 74   Temp 97.8 F (36.6 C) (Oral)   Resp 16   Ht 5\' 6"  (1.676 m)   Wt 102.1 kg   SpO2 99%   BMI 36.32 kg/m   Physical Exam Vitals and nursing note reviewed.  Constitutional:      Appearance: She is well-developed.  HENT:     Head: Normocephalic.     Comments: Patient has 6 cm gaping laceration linear left upper forehead with persistent bleeding that is controlled with pressure.  No step-off. Eyes:     General:        Right eye: No  discharge.        Left eye: No discharge.     Conjunctiva/sclera: Conjunctivae normal.  Neck:     Trachea: No tracheal deviation.  Cardiovascular:     Rate and Rhythm: Normal rate and regular rhythm.  Pulmonary:     Effort: Pulmonary effort is normal.     Breath sounds: Normal breath sounds.  Abdominal:     General: There is no distension.     Palpations: Abdomen is soft.     Tenderness: There is no abdominal tenderness. There is no guarding.  Musculoskeletal:        General: Swelling and tenderness present.     Cervical back: Normal range of motion and neck supple.     Comments: Patient has mild paraspinal midline cervical tenderness.  Skin:    General: Skin is warm.     Findings: No rash.  Neurological:     General: No focal deficit present.     Mental Status: She is alert and oriented to person, place, and time.     GCS: GCS eye subscore is 4. GCS verbal subscore is 5. GCS motor subscore is 6.     Cranial Nerves: Cranial nerves are intact.     Motor: Motor function is intact.     Coordination: Coordination is intact.  Psychiatric:        Mood and Affect: Mood normal.     ED Results / Procedures / Treatments   Labs (all labs ordered are listed, but only abnormal results are displayed) Labs Reviewed - No data to display  EKG None  Radiology CT Head Wo Contrast  Result Date: 10/19/2019 CLINICAL DATA:  Trip and fall, facial trauma, forehead laceration EXAM: CT HEAD WITHOUT CONTRAST CT CERVICAL SPINE WITHOUT CONTRAST TECHNIQUE: Multidetector CT imaging of the head and cervical spine was performed following the standard protocol without intravenous contrast. Multiplanar CT image reconstructions of the cervical spine were also generated. COMPARISON:  03/31/2010 FINDINGS: CT HEAD FINDINGS Brain: No evidence of acute infarction, hemorrhage, hydrocephalus, extra-axial collection or mass lesion/mass effect. Vascular: No hyperdense vessel or unexpected calcification. Skull:  Normal. Negative for fracture or focal lesion. Sinuses/Orbits: No acute finding. Other: Soft tissue laceration and hematoma of the left forehead. CT CERVICAL SPINE FINDINGS Alignment: Positional straightening of the normal cervical lordosis. Skull base and vertebrae: No acute fracture. No primary bone lesion or focal pathologic  process. Soft tissues and spinal canal: No prevertebral fluid or swelling. No visible canal hematoma. Disc levels: Focal disc space height loss and osteophytosis of C6-C7. Upper chest: Negative. Other: None. IMPRESSION: 1.  No acute intracranial pathology. 2. Soft tissue laceration and hematoma of the left forehead. 3.  No fracture or static subluxation of the cervical spine. Electronically Signed   By: Lauralyn Primes M.D.   On: 10/19/2019 12:32   CT Cervical Spine Wo Contrast  Result Date: 10/19/2019 CLINICAL DATA:  Trip and fall, facial trauma, forehead laceration EXAM: CT HEAD WITHOUT CONTRAST CT CERVICAL SPINE WITHOUT CONTRAST TECHNIQUE: Multidetector CT imaging of the head and cervical spine was performed following the standard protocol without intravenous contrast. Multiplanar CT image reconstructions of the cervical spine were also generated. COMPARISON:  03/31/2010 FINDINGS: CT HEAD FINDINGS Brain: No evidence of acute infarction, hemorrhage, hydrocephalus, extra-axial collection or mass lesion/mass effect. Vascular: No hyperdense vessel or unexpected calcification. Skull: Normal. Negative for fracture or focal lesion. Sinuses/Orbits: No acute finding. Other: Soft tissue laceration and hematoma of the left forehead. CT CERVICAL SPINE FINDINGS Alignment: Positional straightening of the normal cervical lordosis. Skull base and vertebrae: No acute fracture. No primary bone lesion or focal pathologic process. Soft tissues and spinal canal: No prevertebral fluid or swelling. No visible canal hematoma. Disc levels: Focal disc space height loss and osteophytosis of C6-C7. Upper chest:  Negative. Other: None. IMPRESSION: 1.  No acute intracranial pathology. 2. Soft tissue laceration and hematoma of the left forehead. 3.  No fracture or static subluxation of the cervical spine. Electronically Signed   By: Lauralyn Primes M.D.   On: 10/19/2019 12:32    Procedures .Marland KitchenLaceration Repair  Date/Time: 10/19/2019 10:58 AM Performed by: Blane Ohara, MD Authorized by: Blane Ohara, MD   Consent:    Consent obtained:  Verbal   Consent given by:  Patient   Risks discussed:  Infection, pain, poor cosmetic result and need for additional repair   Alternatives discussed:  No treatment Anesthesia (see MAR for exact dosages):    Anesthesia method:  Local infiltration   Local anesthetic:  Lidocaine 1% WITH epi Laceration details:    Location:  Face   Face location:  Forehead   Length (cm):  7   Depth (mm):  10 Repair type:    Repair type:  Complex Pre-procedure details:    Preparation:  Patient was prepped and draped in usual sterile fashion and imaging obtained to evaluate for foreign bodies Exploration:    Hemostasis achieved with:  Direct pressure and epinephrine   Wound exploration: wound explored through full range of motion     Wound extent: no nerve damage noted and no vascular damage noted     Contaminated: no   Treatment:    Area cleansed with:  Betadine and saline   Amount of cleaning:  Extensive   Irrigation solution:  Sterile saline   Irrigation volume:  100   Irrigation method:  Syringe   Debridement:  Minimal   Undermining:  Minimal   Scar revision: no   Skin repair:    Repair method:  Sutures   Suture size:  6-0   Suture material:  Nylon   Suture technique:  Simple interrupted   Number of sutures:  8 Approximation:    Approximation:  Close Post-procedure details:    Dressing:  Open (no dressing)   Patient tolerance of procedure:  Tolerated well, no immediate complications   (including critical care time)  Medications Ordered in  ED Medications   lidocaine-EPINEPHrine (XYLOCAINE W/EPI) 2 %-1:200000 (PF) injection (has no administration in time range)  Tdap (BOOSTRIX) injection 0.5 mL (has no administration in time range)  diazepam (VALIUM) tablet 5 mg (has no administration in time range)  morphine 4 MG/ML injection 4 mg (4 mg Intravenous Given 10/19/19 1041)  sodium chloride 0.9 % bolus 500 mL (1,000 mLs Intravenous New Bag/Given 10/19/19 1405)  ketorolac (TORADOL) 15 MG/ML injection 15 mg (15 mg Intravenous Given 10/19/19 1357)  HYDROcodone-acetaminophen (NORCO/VICODIN) 5-325 MG per tablet 2 tablet (2 tablets Oral Given 10/19/19 1357)    ED Course  I have reviewed the triage vital signs and the nursing notes.  Pertinent labs & imaging results that were available during my care of the patient were reviewed by me and considered in my medical decision making (see chart for details).    MDM Rules/Calculators/A&P                        Patient presents after acute head injury with significant laceration.   Plan for laceration repair supportive care and close outpatient follow-up.  Patient Nexus positive CT scan cervical spine pending, CT scan of the head to look for skull fracture.  Pain medicines given.  Sutures absorbable placed with good alignment of laceration.  Strict reasons to return given.  CT scan no acute fracture or bleeding.  Patient stable for outpatient follow-up.  Oral fluids.  On discharge patient having worsening right trapezius muscle spasm and tenderness.  No midline cervical or thoracic tenderness.  Patient has normal strength upper and lower extremities bilateral.  Patient denies any tingling or pain shooting down her arms or legs.  IV and oral fluids given.  IV and oral pain medicines given.  Muscle relaxant ordered.  Discussed supportive care and reasons to return.  Patient awaiting a ride.  Final Clinical Impression(s) / ED Diagnoses Final diagnoses:  Acute head injury, initial encounter  Contusion of  face, initial encounter  Facial laceration, initial encounter  Concussion without loss of consciousness, initial encounter    Rx / DC Orders ED Discharge Orders         Ordered    HYDROcodone-acetaminophen (NORCO) 5-325 MG tablet  Every 6 hours PRN     10/19/19 1248    cyclobenzaprine (FLEXERIL) 10 MG tablet  2 times daily PRN     10/19/19 1459           Blane Ohara, MD 10/19/19 1251    Blane Ohara, MD 10/19/19 1501

## 2019-10-22 ENCOUNTER — Encounter (HOSPITAL_COMMUNITY): Payer: Self-pay | Admitting: Emergency Medicine

## 2019-10-22 ENCOUNTER — Emergency Department (HOSPITAL_COMMUNITY): Payer: Commercial Managed Care - PPO

## 2019-10-22 ENCOUNTER — Emergency Department (HOSPITAL_COMMUNITY)
Admission: EM | Admit: 2019-10-22 | Discharge: 2019-10-22 | Disposition: A | Discharge status: Home / Self Care | Payer: Commercial Managed Care - PPO | Attending: Emergency Medicine | Admitting: Emergency Medicine

## 2019-10-22 ENCOUNTER — Other Ambulatory Visit: Payer: Self-pay

## 2019-10-22 DIAGNOSIS — Z87891 Personal history of nicotine dependence: Secondary | ICD-10-CM | POA: Diagnosis not present

## 2019-10-22 DIAGNOSIS — W108XXA Fall (on) (from) other stairs and steps, initial encounter: Secondary | ICD-10-CM | POA: Insufficient documentation

## 2019-10-22 DIAGNOSIS — S6992XA Unspecified injury of left wrist, hand and finger(s), initial encounter: Secondary | ICD-10-CM | POA: Diagnosis not present

## 2019-10-22 DIAGNOSIS — S0011XA Contusion of right eyelid and periocular area, initial encounter: Secondary | ICD-10-CM | POA: Diagnosis not present

## 2019-10-22 DIAGNOSIS — Y9301 Activity, walking, marching and hiking: Secondary | ICD-10-CM | POA: Diagnosis not present

## 2019-10-22 DIAGNOSIS — Y999 Unspecified external cause status: Secondary | ICD-10-CM | POA: Diagnosis not present

## 2019-10-22 DIAGNOSIS — I10 Essential (primary) hypertension: Secondary | ICD-10-CM | POA: Insufficient documentation

## 2019-10-22 DIAGNOSIS — Z79899 Other long term (current) drug therapy: Secondary | ICD-10-CM | POA: Diagnosis not present

## 2019-10-22 DIAGNOSIS — S0010XA Contusion of unspecified eyelid and periocular area, initial encounter: Secondary | ICD-10-CM

## 2019-10-22 DIAGNOSIS — S060X0A Concussion without loss of consciousness, initial encounter: Secondary | ICD-10-CM | POA: Insufficient documentation

## 2019-10-22 DIAGNOSIS — S0012XA Contusion of left eyelid and periocular area, initial encounter: Secondary | ICD-10-CM | POA: Insufficient documentation

## 2019-10-22 DIAGNOSIS — Y929 Unspecified place or not applicable: Secondary | ICD-10-CM | POA: Diagnosis not present

## 2019-10-22 DIAGNOSIS — S0990XA Unspecified injury of head, initial encounter: Secondary | ICD-10-CM | POA: Diagnosis present

## 2019-10-22 MED ORDER — MECLIZINE HCL 12.5 MG PO TABS
25.0000 mg | ORAL_TABLET | Freq: Once | ORAL | Status: AC
  Administered 2019-10-22: 25 mg via ORAL
  Filled 2019-10-22: qty 2

## 2019-10-22 MED ORDER — BUTALBITAL-APAP-CAFFEINE 50-325-40 MG PO TABS: 1.0000 | ORAL_TABLET | Freq: Four times a day (QID) | ORAL | 0 refills | Status: AC | PRN

## 2019-10-22 MED ORDER — MELOXICAM 15 MG PO TABS: 15.0000 mg | ORAL_TABLET | Freq: Every day | ORAL | 0 refills | Status: AC

## 2019-10-22 MED ORDER — MECLIZINE HCL 25 MG PO TABS: 25.0000 mg | ORAL_TABLET | Freq: Three times a day (TID) | ORAL | 0 refills | Status: AC | PRN

## 2019-10-22 NOTE — ED Notes (Signed)
Pt presents to ED with worsening neck pain and nausea since falling Thursday and hitting brick steps with face. Pt denies LOC.

## 2019-10-22 NOTE — ED Triage Notes (Signed)
Patient fell on Thursday and hit head on brick step. Patient denies any LOC. Patient seen here in ED after fall and had CTs done. Per patient lethargic today with nausea. Per family slightly altered.Denies any dizziness or blurred vision. Bruising and swelling to orbit. Denies taking any type of anticoagulant.

## 2019-10-22 NOTE — Discharge Instructions (Signed)
Your x-rays were negative for acute abnormality.  Because you have tenderness over the scaphoid bone I have placed you in a ulnar gutter splint.  You will need to follow-up with orthopedic physician in about a week for reevaluation.  The reason is that sometimes this fracture can be missed on plain films and it is a bone that does not receive good blood supply and has a tendency to heal poorly.  Is imperative that you follow-up with orthopedics. I am discharging you with medication for your headache, pain, and dizziness. Is make sure to read about concussions to the you understand the symptoms.  These can last between 10 days and 1 month. Contact a health care provider if: Your symptoms get worse or they do not improve. You have new symptoms. You have another injury. Get help right away if: You have severe or worsening headaches. You have weakness or numbness in any part of your body. You are confused. Your coordination gets worse. You vomit repeatedly. You are sleepier than normal. Your speech is slurred. You cannot recognize people or places. You have a seizure. It is difficult to wake you up. You have unusual behavior changes. You have changes in your vision. You lose consciousness.

## 2019-10-22 NOTE — ED Provider Notes (Signed)
[facility] EMERGENCY DEPARTMENT Provider Note   CSN: [id number] Arrival date & time: 10/22/19  1137     History Chief Complaint  Patient presents with  . Fall    Karen Mccormick is a 53 y.o. female.  The history is provided by the patient and medical records. No language interpreter was used.  Head Injury Location:  Frontal Time since incident:  3 days Mechanism of injury: fall   Fall:    Fall occurred:  Tripped and walking (up brick stairs)   Height of fall:  3 ft   Impact surface: brick stair case.   Point of impact:  Head and hands   Entrapped after fall: no   Pain details:    Quality:  Aching and throbbing   Radiates to:  Face   Severity:  Moderate   Duration:  3 days   Timing:  Constant   Progression:  Unchanged Chronicity:  New Relieved by:  Prescription drugs and rest Worsened by:  Nothing Associated symptoms: headache, nausea and neck pain   Associated symptoms: no blurred vision, no difficulty breathing, no disorientation, no double vision, no focal weakness, no hearing loss, no loss of consciousness, no memory loss, no numbness, no seizures, no tinnitus and no vomiting   Associated symptoms comment:  Room spinning sensation Headaches:    Severity:  Moderate   Timing:  Constant   Progression:  Unchanged   Chronicity:  New      Past Medical History:  Diagnosis Date  . Hypertension     There are no problems to display for this patient.   Past Surgical History:  Procedure Laterality Date  . KNEE SURGERY       OB History    Gravida  2   Para  2   Term  2   Preterm      AB      Living        SAB      TAB      Ectopic      Multiple      Live Births              Family History  Problem Relation Age of Onset  . Diabetes Mother   . Diabetes Brother     Social History   Tobacco Use  . Smoking status: Former Smoker    Types: Cigarettes    Quit date: 11/27/2002    Years since quitting: 16.9  . Smokeless tobacco:  Never Used  Substance Use Topics  . Alcohol use: Yes    Comment: ocass  . Drug use: No    Home Medications Prior to Admission medications   Medication Sig Start Date End Date Taking? Authorizing Provider  albuterol (PROVENTIL HFA;VENTOLIN HFA) 108 (90 BASE) MCG/ACT inhaler 2 puffs 4 times a day for the next 4 days then as needed for wheezing. 11/28/12   Prentiss Bells, MD  cefdinir (OMNICEF) 300 MG capsule Take 300 mg by mouth 2 (two) times daily. 11/20/2012    [provider]  clonazePAM (KLONOPIN) 1 MG tablet Take 1 mg by mouth at bedtime as needed. sleep    [provider]  cyclobenzaprine (FLEXERIL) 10 MG tablet Take 1 tablet (10 mg total) by mouth 2 (two) times daily as needed for muscle spasms. 10/19/19   Elnora Morrison, MD  HYDROcodone-acetaminophen (NORCO) 5-325 MG tablet Take 1-2 tablets by mouth every 6 (six) hours as needed for up to 3 days. 10/19/19 10/22/19  Blane OharaZavitz, Joshua, MD  HYDROcodone-homatropine [facility](HYCODAN) 5-1.5 MG/5ML syrup Take 5 mLs by mouth every 6 (six) hours as needed. cough    [provider]  levonorgestrel (MIRENA) 20 MCG/24HR IUD 1 each by Intrauterine route once.    [provider]  lisinopril (PRINIVIL,ZESTRIL) 5 MG tablet Take 5 mg by mouth daily.    [provider]  predniSONE (DELTASONE) 10 MG tablet Take 2 tablets (20 mg total) by mouth daily. 11/28/12   Annamarie DawleyStrand, Terry S, MD  ranitidine (ZANTAC) 150 MG tablet Take 150 mg by mouth 2 (two) times daily.    [provider]  venlafaxine XR (EFFEXOR-XR) 150 MG 24 hr capsule Take 150 mg by mouth daily.    [provider]    Allergies    Penicillins  Review of Systems   Review of Systems  Constitutional: Positive for fatigue. Negative for activity change and appetite change.  HENT: Positive for facial swelling. Negative for hearing loss and tinnitus.   Eyes: Negative for blurred vision, double vision, photophobia and visual disturbance.    Gastrointestinal: Positive for nausea. Negative for vomiting.  Musculoskeletal: Positive for neck pain.  Skin: Positive for wound.  Neurological: Positive for headaches. Negative for focal weakness, seizures, loss of consciousness and numbness.  Psychiatric/Behavioral: Positive for decreased concentration and sleep disturbance. Negative for memory loss.    Physical Exam Updated Vital Signs BP (!) 134/91 (BP Location: Right Arm)   Pulse (!) 119   Temp 99.4 F (37.4 C) (Oral)   Resp 18   Ht 5\' 6"  (1.676 m)   Wt 102.1 kg   SpO2 95%   BMI 36.32 kg/m   Physical Exam Vitals and nursing note reviewed.  Constitutional:      General: She is not in acute distress.    Appearance: She is well-developed. She is not diaphoretic.  HENT:     Head: Normocephalic.     Comments: Well healing Laceration of the left forehead. Eyes:     General: Vision grossly intact. No scleral icterus.    Conjunctiva/sclera:     Right eye: Right conjunctiva is not injected. No chemosis, exudate or hemorrhage.    Left eye: Left conjunctiva is not injected. No chemosis, exudate or hemorrhage.    Pupils: Pupils are equal, round, and reactive to light.     Comments: No horizontal, vertical or rotational nystagmus BL periorbital ecchymosis and swelling. No pain with eye movement.   Neck:     Comments: Full active and passive ROM without pain No midline or paraspinal tenderness No nuchal rigidity or meningeal signs Cardiovascular:     Rate and Rhythm: Normal rate and regular rhythm.  Pulmonary:     Effort: Pulmonary effort is normal. No respiratory distress.     Breath sounds: Normal breath sounds. No wheezing or rales.  Abdominal:     General: Bowel sounds are normal.     Palpations: Abdomen is soft.     Tenderness: There is no abdominal tenderness. There is no guarding or rebound.  Musculoskeletal:        General: Tenderness present. Normal range of motion.     Cervical back: Normal range of motion and  neck supple.     Comments: Left wrist tenderness over the scaphoid. Pain with wrist movement  normal strength, sensation,  NVI  Lymphadenopathy:     Cervical: No cervical adenopathy.  Skin:    General: Skin is warm and dry.     Findings: No rash.  Neurological:  Mental Status: She is alert and oriented to person, place, and time.     Cranial Nerves: No cranial nerve deficit.     Motor: No abnormal muscle tone.     Coordination: Coordination normal.     Deep Tendon Reflexes: Reflexes are normal and symmetric.     Comments: Mental Status:  Alert, oriented, thought content appropriate. Speech fluent without evidence of aphasia. Able to follow 2 step commands without difficulty.  Cranial Nerves:  II:  Peripheral visual fields grossly normal, pupils equal, round, reactive to light III,IV, VI: ptosis not present, extra-ocular motions intact bilaterally  V,VII: smile symmetric, facial light touch sensation equal VIII: hearing grossly normal bilaterally  IX,X: midline uvula rise  XI: bilateral shoulder shrug equal and strong XII: midline tongue extension  Motor:  5/5 in upper and lower extremities bilaterally including strong and equal grip strength and dorsiflexion/plantar flexion Sensory: Pinprick and light touch normal in all extremities.  Deep Tendon Reflexes: 2+ and symmetric  Cerebellar: normal finger-to-nose with bilateral upper extremities Gait: normal gait  CV: distal pulses palpable throughout   Psychiatric:        Behavior: Behavior normal.        Thought Content: Thought content normal.        Judgment: Judgment normal.     ED Results / Procedures / Treatments   Labs (all labs ordered are listed, but only abnormal results are displayed) Labs Reviewed - No data to display  EKG None  Radiology No results found.  Procedures Procedures (including critical care time)  Medications Ordered in ED Medications - No data to display  ED Course  I have reviewed the  triage vital signs and the nursing notes.  Pertinent labs & imaging results that were available during my care of the patient were reviewed by me and considered in my medical decision making (see chart for details).    MDM Rules/Calculators/A&P     CHA2DS2/VAS Stroke Risk Points      N/A >= 2 Points: High Risk  1 - 1.99 Points: Medium Risk  0 Points: Low Risk    A final score could not be computed because of missing components.: Last  Change: N/A     This score determines the patient's risk of having a stroke if the  patient has atrial fibrillation.      This score is not applicable to this patient. Components are not  calculated.                   52 y/ F returns to the ER for evaluation after a mechanical fall. She has new periorbitals swelling and ecchymosis likely secondary to hematoma and gravity. No pain with eye movement concerning for orbital fracture a or entrapment and CT head and C spine which I personally reviewed in EMR shows no acute intracranial, facial, or cervical pathology due to trauma. The patient states that she has had dizziness, frontal headache, mild nausea and difficulty sleeping. She is able to ambulate steadily.   Patient with periorbital ecchymosis and swelling likely secondary to dependent movement of hematoma seen on CT scan.  Patient has symptoms of concussion.  Will discharge with meclizine and Fioricet for her headache.  Patient also has tenderness over the scaphoid of the left wrist.  With negative left hand and wrist x-ray by my interpretation.  Placed in thumb spica splint.  Given outpatient follow-up instructions and return precautions.  Final Clinical Impression(s) / ED Diagnoses Final diagnoses:  None  Rx / DC Orders ED Discharge Orders    None       Arthor Captain, PA-C 10/22/19 1331    Bethann Berkshire, MD 10/24/19 [phone]

## 2019-11-08 ENCOUNTER — Encounter: Payer: Self-pay | Admitting: Orthopedic Surgery

## 2019-11-08 ENCOUNTER — Ambulatory Visit (INDEPENDENT_AMBULATORY_CARE_PROVIDER_SITE_OTHER): Payer: Commercial Managed Care - PPO | Admitting: Orthopedic Surgery

## 2019-11-08 ENCOUNTER — Other Ambulatory Visit: Payer: Self-pay

## 2019-11-08 VITALS — BP 133/90 | HR 106 | Ht 66.0 in | Wt 218.0 lb

## 2019-11-08 DIAGNOSIS — S60222A Contusion of left hand, initial encounter: Secondary | ICD-10-CM

## 2019-11-08 NOTE — Patient Instructions (Addendum)
Karen Mccormick your x-ray was negative you obviously injured the soft tissues on the dorsum of the hand and ulnar side of the wrist.  This should resolve with time  Recommend OOW NEXT 3 WEEKS   WORK ON GRIP STRENGTH  Follow-up 3 weeks

## 2019-11-08 NOTE — Progress Notes (Signed)
EMERGENCY ROOM FOLLOW UP  NEW PROBLEM/PATIENT   Patient ID: Karen Mccormick, female   DOB: 1966-01-13, 53 y.o.   MRN: [id number]  Emergency room record from (date) October 19, 2019 has been reviewed and this is included by reference and includes the review of systems with the following addition:   Chief Complaint  Patient presents with  . Wrist Pain    left wrist injury 10/19/19    HPI Karen Mccormick is a 53 y.o. female.  Presents for evaluation of left hand and wrist pain after falling on the 10th landing on some steps perhaps hyperextending the fingers of the hand at the metatarsophalangeal joints with some pain as well on the ulnar side of the joint.  Although things seem to be getting a little bit better she still having quite a bit of pain trouble gripping still has some swelling and tenderness on the dorsum of the hand.  She was given a splint it did not help she tried Ace wrap that did not help  She presents for evaluation and management     Review of Systems Review of Systems  Constitutional: Negative for fever.  Neurological: Positive for weakness. Negative for numbness.     has a past medical history of Hypertension.   Past Surgical History:  Procedure Laterality Date  . KNEE SURGERY      Family History  Problem Relation Age of Onset  . Diabetes Mother   . Diabetes Brother     Social History Social History   Tobacco Use  . Smoking status: Former Smoker    Types: Cigarettes    Quit date: 11/27/2002    Years since quitting: 16.9  . Smokeless tobacco: Never Used  Substance Use Topics  . Alcohol use: Yes    Comment: ocass  . Drug use: No    Allergies  Allergen Reactions  . Penicillins Nausea And Vomiting    Current Outpatient Medications  Medication Sig Dispense Refill  . amLODipine (NORVASC) 5 MG tablet Take by mouth.    . butalbital-acetaminophen-caffeine (FIORICET) 50-325-40 MG tablet Take 1 tablet by mouth every 6 (six) hours as needed. 20 tablet  0  . cyclobenzaprine (FLEXERIL) 10 MG tablet Take 1 tablet (10 mg total) by mouth 2 (two) times daily as needed for muscle spasms. 12 tablet 0  . HYDROcodone-homatropine (HYCODAN) 5-1.5 MG/5ML syrup Take 5 mLs by mouth every 6 (six) hours as needed. cough    . levonorgestrel (MIRENA) 20 MCG/24HR IUD 1 each by Intrauterine route once.    . meclizine (ANTIVERT) 25 MG tablet Take 1 tablet (25 mg total) by mouth 3 (three) times daily as needed for dizziness. 30 tablet 0  . meloxicam (MOBIC) 15 MG tablet Take 1 tablet (15 mg total) by mouth daily. Take 1 daily with food. 10 tablet 0  . venlafaxine XR (EFFEXOR-XR) 150 MG 24 hr capsule Take 150 mg by mouth daily.     No current facility-administered medications for this visit.    Physical Exam BP 133/90   Pulse (!) 106   Ht 5\' 6"  (1.676 m)   Wt 218 lb (98.9 kg)   BMI 35.19 kg/m  Body mass index is 35.19 kg/m.  Well developed and well nourished  Stands with normal weight bearing line without abnormality Alert and oriented x 3  Normal affect and mood  Ortho Exam  Right hand normal skin no tenderness normal range of motion no instability normal muscle tone  Left hand tenderness in the  interspaces of the dorsum of the hand between the index and long finger and long and ring finger and tenderness also on the ulnar side of the wrist  Everything is working in terms of tendon function without weakness although we did reproduce the pain with extension of the metacarpophalangeal joints against resistance  Her grip strength is weak  There is no instability of any of the joints  Skin was normal  Neurovascular exam was intact   Data Reviewed IMAGING From THE ER AND THE REPORT ARE REVIEWED, MY INTERPRETATION OF THE IMAGE(S) IS : Three views left hand no fracture dislocation or abnormality is seen  Assessment  Soft tissue injury of the left hand  Plan   Recommend grip strength and exercises to maintain range of motion  Return 3 weeks  out of work 3 weeks   Encounter Diagnosis  Name Primary?  . Contusion of left hand, initial encounter Yes     Fuller Canada, MD 11/08/2019 3:52 PM

## 2019-11-29 ENCOUNTER — Encounter: Payer: Self-pay | Admitting: Orthopedic Surgery

## 2019-11-29 ENCOUNTER — Ambulatory Visit (INDEPENDENT_AMBULATORY_CARE_PROVIDER_SITE_OTHER): Payer: Commercial Managed Care - PPO | Admitting: Orthopedic Surgery

## 2019-11-29 ENCOUNTER — Other Ambulatory Visit: Payer: Self-pay

## 2019-11-29 VITALS — Temp 98.2°F | Ht 66.0 in | Wt 218.0 lb

## 2019-11-29 DIAGNOSIS — S60222D Contusion of left hand, subsequent encounter: Secondary | ICD-10-CM | POA: Diagnosis not present

## 2019-11-29 NOTE — Patient Instructions (Signed)
Return to work follow-up as needed

## 2019-11-29 NOTE — Progress Notes (Signed)
Chief complaint pain hand and left wrist  Injury 10/19/2019  54 year old female landed on some steps hyperextending her fingers and hand at the MCP joints diagnosed with contusion  She was advised to perform active range of motion exercises work on grip strength she was out of work for about 3 weeks  She says she has improved significantly has a little bit of discomfort but is moving in the right direction  Her exam shows full range of motion normal grip strength some soreness in the MTP joints normal wrist range of motion  She can resume work follow-up with [place] as needed

## 2019-11-30 ENCOUNTER — Telehealth: Payer: Self-pay | Admitting: Orthopedic Surgery

## 2021-02-09 IMAGING — CT CT HEAD W/O CM
3 series · 14 of 47 positions shown, 16 images · non-contrast
Comparison: 03/31/2010

CLINICAL DATA: Trip and fall, facial trauma, forehead laceration

EXAM:
CT HEAD WITHOUT CONTRAST
CT CERVICAL SPINE WITHOUT CONTRAST
TECHNIQUE: Multidetector CT imaging of the head and cervical spine was
performed following the standard protocol without intravenous
contrast. Multiplanar CT image reconstructions of the cervical spine
were also generated.

[Series 3: head w o · axial · 0.45mm/px · z∈[+1441,+1571]mm · 8 of 32 slices shown, 10 images]
[im 3/32  brain]
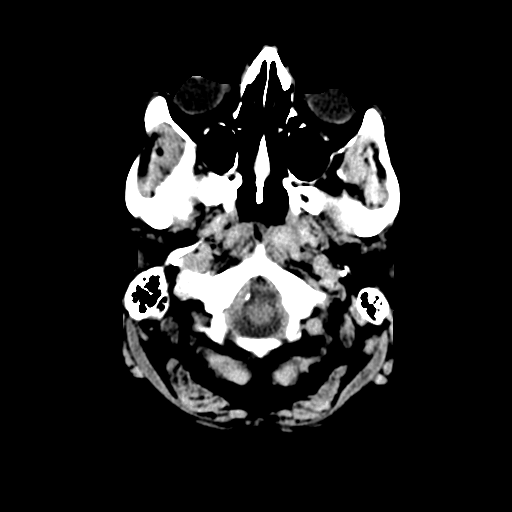
[im 3/32  bone]
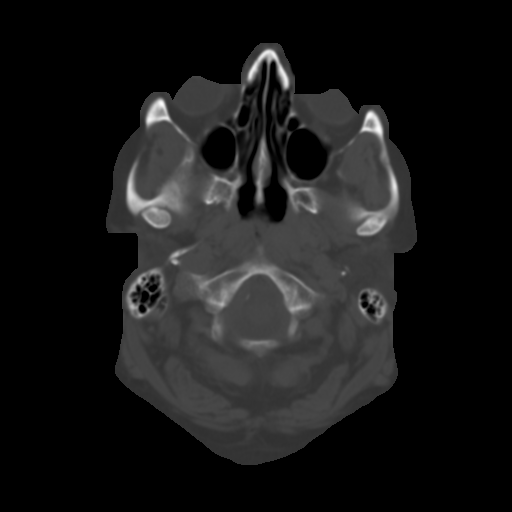
[im 7/32  brain]
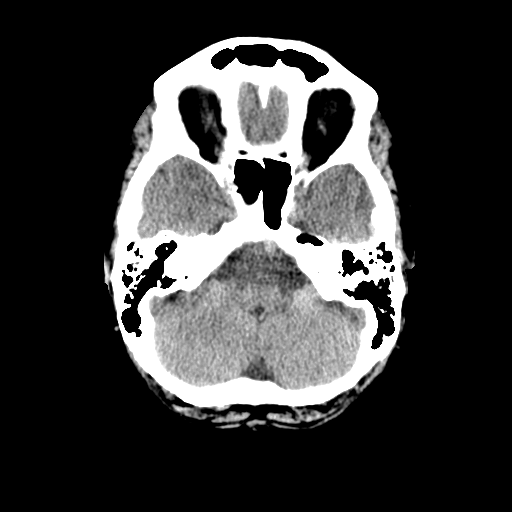
[im 10/32  brain]
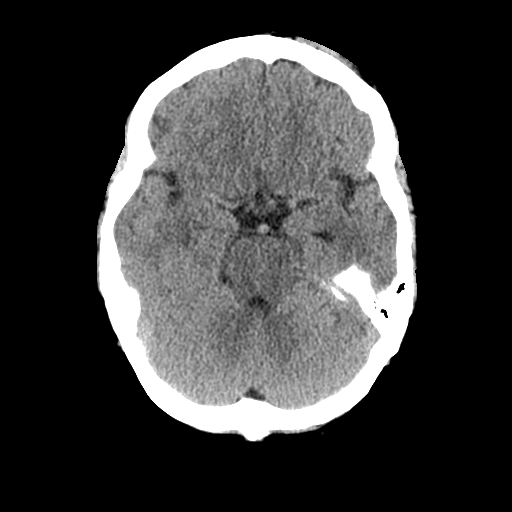
[im 14/32  brain]
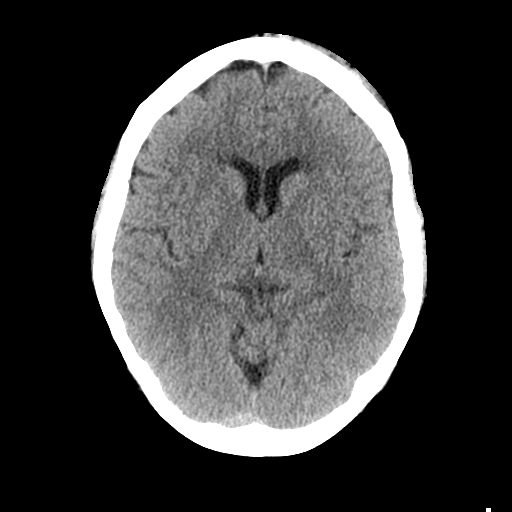
[im 18/32  brain]
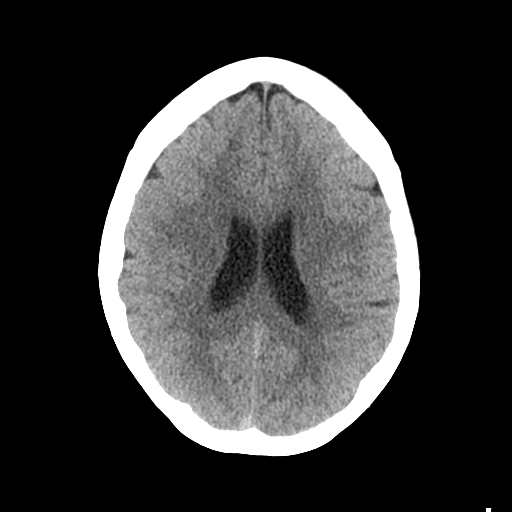
[im 18/32  bone]
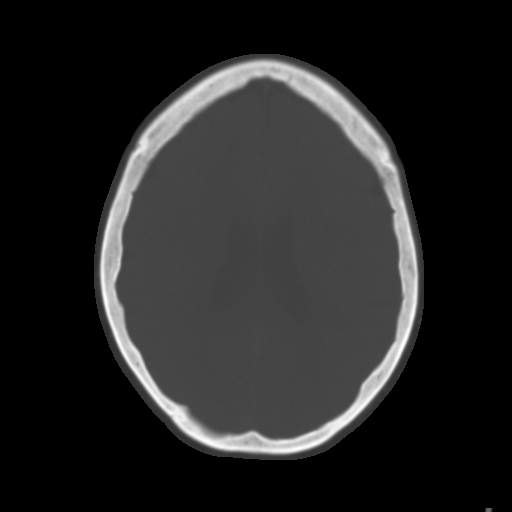
[im 22/32  brain]
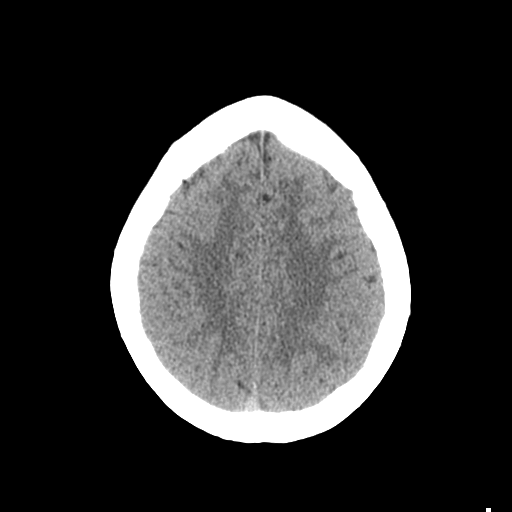
[im 25/32  brain]
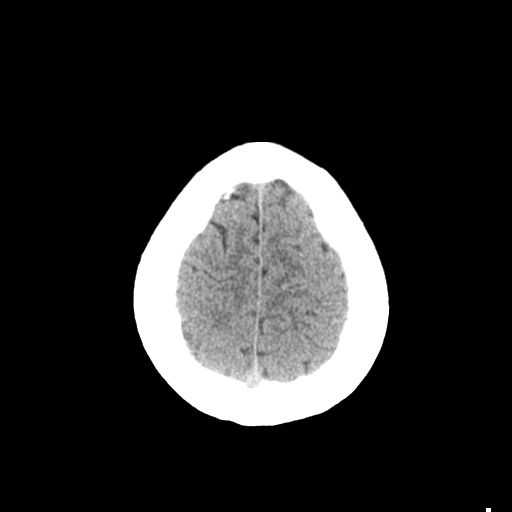
[im 29/32  brain]
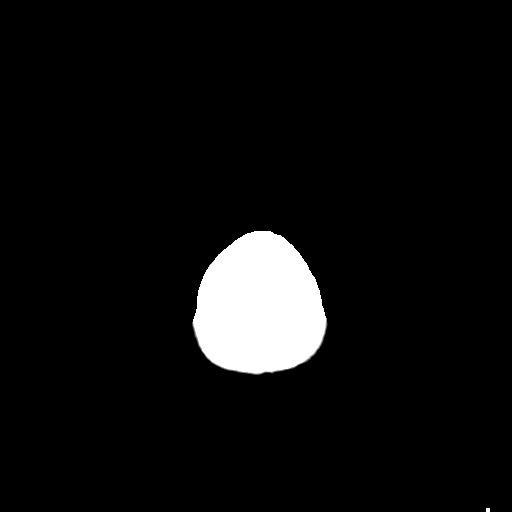

[Series 5: coronal soft · coronal · 0.31mm/px · 3 of 77 slices shown]
[im 26/77  brain]
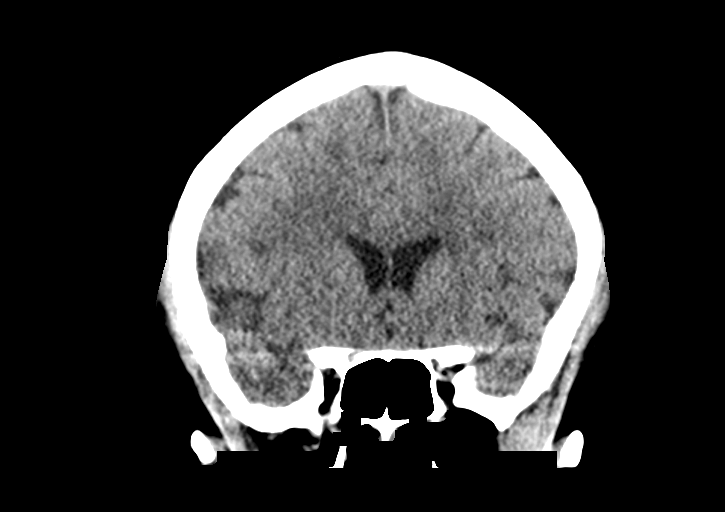
[im 34/77  brain]
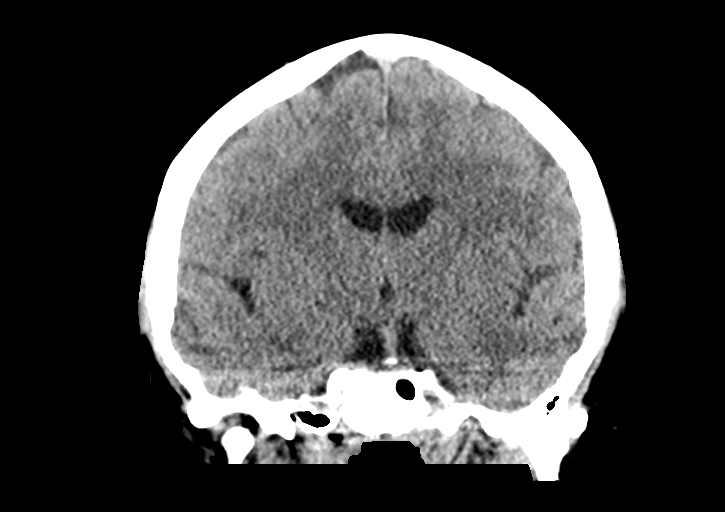
[im 43/77  brain]
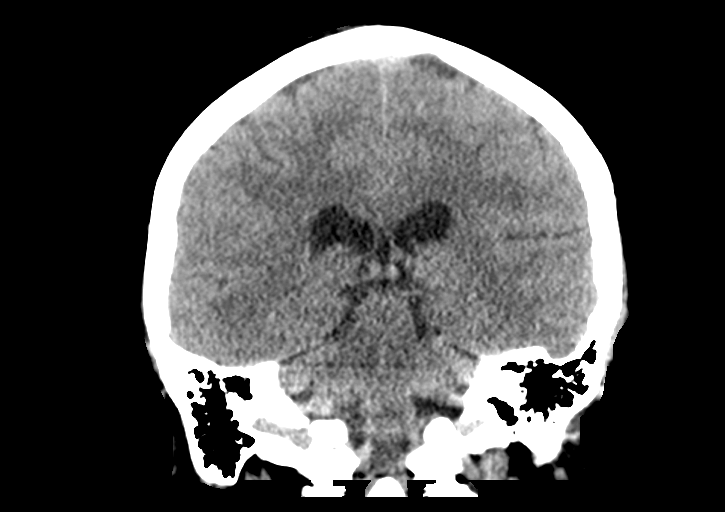

[Series 6: sagittal soft · sagittal · 0.31mm/px · 3 of 62 slices shown]
[im 21/62  brain]
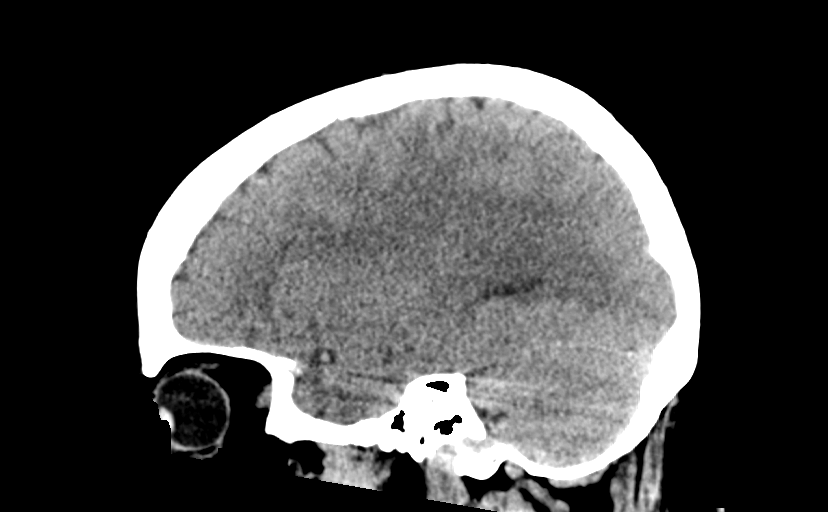
[im 31/62  brain]
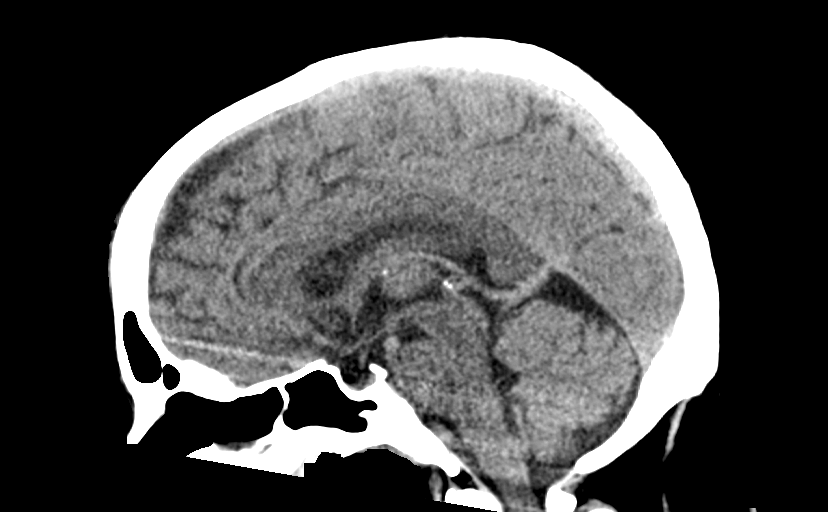
[im 41/62  brain]
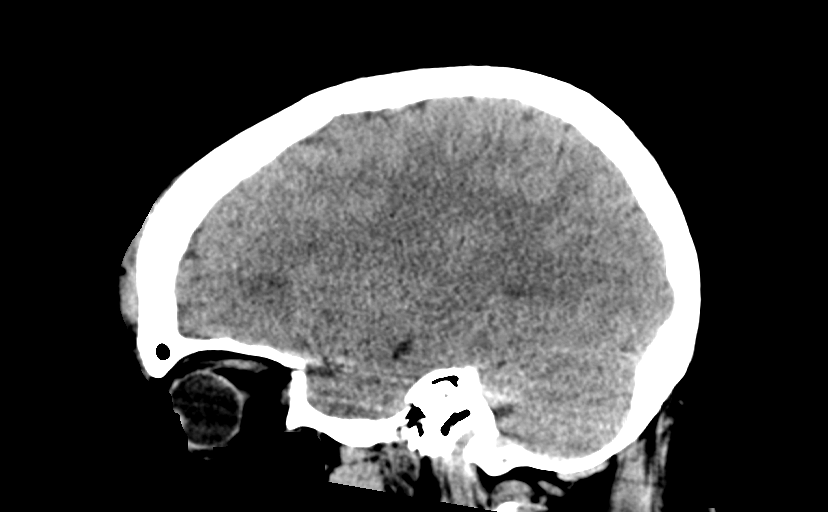

[14 of 47 positions shown; findings below may reference images not displayed]

FINDINGS: CT HEAD FINDINGS

Brain: No evidence of acute infarction, hemorrhage, hydrocephalus,
extra-axial collection or mass lesion/mass effect.

Vascular: No hyperdense vessel or unexpected calcification.

Skull: Normal. Negative for fracture or focal lesion.

Sinuses/Orbits: No acute finding.

Other: Soft tissue laceration and hematoma of the left forehead.

CT CERVICAL SPINE FINDINGS

Alignment: Positional straightening of the normal cervical lordosis.

Skull base and vertebrae: No acute fracture. No primary bone lesion
or focal pathologic process.

Soft tissues and spinal canal: No prevertebral fluid or swelling. No
visible canal hematoma.

Disc levels: Focal disc space height loss and osteophytosis of
C6-C7.

Upper chest: Negative.

Other: None.
IMPRESSION: 1.  No acute intracranial pathology.

2. Soft tissue laceration and hematoma of the left forehead.

3.  No fracture or static subluxation of the cervical spine.

## 2023-04-24 ENCOUNTER — Encounter (HOSPITAL_COMMUNITY): Payer: Self-pay

## 2023-04-24 ENCOUNTER — Emergency Department (HOSPITAL_COMMUNITY)
Admission: EM | Admit: 2023-04-24 | Discharge: 2023-04-24 | Disposition: A | Discharge status: Home / Self Care | Payer: Commercial Managed Care - PPO | Attending: Emergency Medicine | Admitting: Emergency Medicine

## 2023-04-24 DIAGNOSIS — Z79899 Other long term (current) drug therapy: Secondary | ICD-10-CM | POA: Diagnosis not present

## 2023-04-24 DIAGNOSIS — I10 Essential (primary) hypertension: Secondary | ICD-10-CM | POA: Diagnosis not present

## 2023-04-24 DIAGNOSIS — R040 Epistaxis: Secondary | ICD-10-CM | POA: Diagnosis not present

## 2023-04-24 LAB — COMPREHENSIVE METABOLIC PANEL
ALT: 14 U/L (ref 0–44)
AST: 15 U/L (ref 15–41)
Albumin: 3.4 g/dL — ABNORMAL LOW (ref 3.5–5.0)
Alkaline Phosphatase: 109 U/L (ref 38–126)
Anion gap: 10 (ref 5–15)
BUN: 13 mg/dL (ref 6–20)
CO2: 23 mmol/L (ref 22–32)
Calcium: 10.7 mg/dL — ABNORMAL HIGH (ref 8.9–10.3)
Chloride: 103 mmol/L (ref 98–111)
Creatinine, Ser: 0.88 mg/dL (ref 0.44–1.00)
GFR, Estimated: >60 mL/min (ref >60)
Glucose, Bld: 137 mg/dL — ABNORMAL HIGH (ref 70–99)
Potassium: 3.6 mmol/L (ref 3.5–5.1)
Sodium: 136 mmol/L (ref 135–145)
Total Bilirubin: 0.4 mg/dL (ref 0.3–1.2)
Total Protein: 7.6 g/dL (ref 6.5–8.1)

## 2023-04-24 LAB — CBC WITH DIFFERENTIAL/PLATELET
Abs Immature Granulocytes: 0.02 10*3/uL (ref 0.00–0.07)
Basophils Absolute: 0.1 10*3/uL (ref 0.0–0.1)
Basophils Relative: 1 %
Eosinophils Absolute: 0.2 10*3/uL (ref 0.0–0.5)
Eosinophils Relative: 2 %
HCT: 40.2 % (ref 36.0–46.0)
Hemoglobin: 13 g/dL (ref 12.0–15.0)
Immature Granulocytes: 0 %
Lymphocytes Relative: 34 %
Lymphs Abs: 3.2 10*3/uL (ref 0.7–4.0)
MCH: 29.1 pg (ref 26.0–34.0)
MCHC: 32.3 g/dL (ref 30.0–36.0)
MCV: 90.1 fL (ref 80.0–100.0)
Monocytes Absolute: 0.5 10*3/uL (ref 0.1–1.0)
Monocytes Relative: 6 %
Neutro Abs: 5.4 10*3/uL (ref 1.7–7.7)
Neutrophils Relative %: 57 %
Platelets: 376 10*3/uL (ref 150–400)
RBC: 4.46 MIL/uL (ref 3.87–5.11)
RDW: 12.7 % (ref 11.5–15.5)
WBC: 9.4 10*3/uL (ref 4.0–10.5)
nRBC: 0 % (ref 0.0–0.2)

## 2023-04-24 MED ORDER — OXYMETAZOLINE HCL 0.05 % NA SOLN
3.0000 | Freq: Once | NASAL | Status: AC
  Administered 2023-04-24: 3 via NASAL
  Filled 2023-04-24: qty 30

## 2023-04-24 NOTE — ED Triage Notes (Signed)
Uncontrolled nosebleed, has happened multiple times recently normally able to stop with pressure and afrin at home. Unable to today. No blood thinners.

## 2023-04-26 NOTE — ED Provider Notes (Signed)
[facility] AT [facility] Provider Note   CSN: [id number] Arrival date & time: 04/24/23  1239     History  Chief Complaint  Patient presents with   Epistaxis    Karen Mccormick is a 57 y.o. female.  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  Reports over the last 2 weeks has had several nosebleeds from the left side.  Has used afrin and applied pressure and they stopped.  No nasal trauma. Stopped taking aspirin to see if it would help. Had started ozempic and read that could be side effect and does not feel it is helping with weight loss and is planning on stopping that. No other symptoms of cp, dyspnea, other concerns. Has never seen ENT.  Today was at work and it just started spontaneously and was more than the other nosebleeds.      Past Medical History:  Diagnosis Date   Hypertension      Home Medications Prior to Admission medications   Medication Sig Start Date End Date Taking? Authorizing Provider  amLODipine (NORVASC) 5 MG tablet Take by mouth. 07/01/18   [provider]  cyclobenzaprine (FLEXERIL) 10 MG tablet Take 1 tablet (10 mg total) by mouth 2 (two) times daily as needed for muscle spasms. 10/19/19   Blane Ohara, MD  HYDROcodone-homatropine [facility]) 5-1.5 MG/5ML syrup Take 5 mLs by mouth every 6 (six) hours as needed. cough    [provider]  levonorgestrel (MIRENA) 20 MCG/24HR IUD 1 each by Intrauterine route once.    [provider]  meclizine (ANTIVERT) 25 MG tablet Take 1 tablet (25 mg total) by mouth 3 (three) times daily as needed for dizziness. 10/22/19   Arthor Captain, PA-C  meloxicam (MOBIC) 15 MG tablet Take 1 tablet (15 mg total) by mouth daily. Take 1 daily with food. 10/22/19   Arthor Captain, PA-C  venlafaxine XR (EFFEXOR-XR) 150 MG 24 hr capsule Take 150 mg by mouth daily.    [provider]      Allergies     Penicillins    Review of Systems   Review of Systems  Physical Exam Updated Vital Signs BP 138/78   Pulse 91   Temp 98.3 F (36.8 C) (Oral)   Resp 18   SpO2 95%  Physical Exam Vitals and nursing note reviewed.  Constitutional:      General: She is not in acute distress.    Appearance: Normal appearance. She is not ill-appearing, toxic-appearing or diaphoretic.  HENT:     Head: Normocephalic.     Nose:     Comments: Left sided epistaxis Eyes:     Conjunctiva/sclera: Conjunctivae normal.  Cardiovascular:     Rate and Rhythm: Normal rate and regular rhythm.     Pulses: Normal pulses.  Pulmonary:     Effort: Pulmonary effort is normal. No respiratory distress.  Musculoskeletal:        General: No deformity or signs of injury.     Cervical back: No rigidity.  Skin:    General: Skin is warm and dry.     Coloration: Skin is not jaundiced or pale.  Neurological:     General: No focal deficit present.     Mental Status: She is alert and oriented to person, place, and time.     ED Results / Procedures / Treatments   Labs (all labs ordered are listed,  but only abnormal results are displayed) Labs Reviewed  COMPREHENSIVE METABOLIC PANEL - Abnormal; Notable for the following components:      Result Value   Glucose, Bld 137 (*)    Calcium 10.7 (*)    Albumin 3.4 (*)    All other components within normal limits  CBC WITH DIFFERENTIAL/PLATELET    EKG None  Radiology No results found.  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  Labs obtained show no significant anemia, no thrombocytopenia, no electrolyte abnormalities with exeption of mild hypercalcemia.  Given afrin, pressure placed and  epistaxis resolved. Referred to ENT for recurrent nosebleeds.          Final Clinical Impression(s) / ED Diagnoses Final diagnoses:  Epistaxis    Rx / DC Orders ED Discharge Orders     None         Alvira Monday, MD 04/26/23 1138

## 2024-05-26 ENCOUNTER — Encounter: Payer: Self-pay | Admitting: Advanced Practice Midwife
